# Patient Record
Sex: Male | Born: 1990 | Hispanic: No | Marital: Single | Smoking: Former smoker
Health system: Southern US, Community
[De-identification: ages and names within clinical notes are randomized; demographics above are authoritative.]

---

## 2010-06-03 ENCOUNTER — Emergency Department (HOSPITAL_COMMUNITY)
Admission: EM | Admit: 2010-06-03 | Discharge: 2010-06-04 | Disposition: A | Discharge status: Home / Self Care | Payer: No Typology Code available for payment source | Attending: Emergency Medicine | Admitting: Emergency Medicine

## 2010-06-03 DIAGNOSIS — Y92410 Unspecified street and highway as the place of occurrence of the external cause: Secondary | ICD-10-CM | POA: Insufficient documentation

## 2010-06-03 DIAGNOSIS — T1490XA Injury, unspecified, initial encounter: Secondary | ICD-10-CM | POA: Insufficient documentation

## 2010-06-03 DIAGNOSIS — M25569 Pain in unspecified knee: Secondary | ICD-10-CM | POA: Insufficient documentation

## 2010-06-04 ENCOUNTER — Emergency Department (HOSPITAL_COMMUNITY): Payer: No Typology Code available for payment source

## 2011-01-25 ENCOUNTER — Emergency Department (HOSPITAL_COMMUNITY)
Admission: EM | Admit: 2011-01-25 | Discharge: 2011-01-26 | Disposition: A | Discharge status: Home / Self Care | Payer: BC Managed Care – PPO | Attending: Emergency Medicine | Admitting: Emergency Medicine

## 2011-01-25 DIAGNOSIS — W3400XA Accidental discharge from unspecified firearms or gun, initial encounter: Secondary | ICD-10-CM

## 2011-01-25 DIAGNOSIS — S31809A Unspecified open wound of unspecified buttock, initial encounter: Secondary | ICD-10-CM | POA: Insufficient documentation

## 2011-01-26 ENCOUNTER — Encounter: Payer: Self-pay | Admitting: Emergency Medicine

## 2011-01-26 ENCOUNTER — Emergency Department (HOSPITAL_COMMUNITY): Payer: BC Managed Care – PPO

## 2011-01-26 DIAGNOSIS — S31809A Unspecified open wound of unspecified buttock, initial encounter: Secondary | ICD-10-CM

## 2011-01-26 LAB — URINALYSIS, MICROSCOPIC ONLY
Glucose, UA: NEGATIVE mg/dL
Leukocytes, UA: NEGATIVE
Nitrite: NEGATIVE
pH: 6 (ref 5.0–8.0)

## 2011-01-26 LAB — PROTIME-INR: INR: 0.98 (ref 0.00–1.49)

## 2011-01-26 LAB — POCT I-STAT, CHEM 8
BUN: 3 mg/dL — ABNORMAL LOW (ref 6–23)
Creatinine, Ser: 0.9 mg/dL (ref 0.50–1.35)
Hemoglobin: 17 g/dL (ref 13.0–17.0)
Potassium: 2.9 mEq/L — ABNORMAL LOW (ref 3.5–5.1)
Sodium: 143 mEq/L (ref 135–145)

## 2011-01-26 LAB — COMPREHENSIVE METABOLIC PANEL
BUN: 5 mg/dL — ABNORMAL LOW (ref 6–23)
CO2: 25 mEq/L (ref 19–32)
Calcium: 9.6 mg/dL (ref 8.4–10.5)
Creatinine, Ser: 0.89 mg/dL (ref 0.50–1.35)
GFR calc Af Amer: >90 mL/min (ref >90)
GFR calc non Af Amer: >90 mL/min (ref >90)
Glucose, Bld: 113 mg/dL — ABNORMAL HIGH (ref 70–99)

## 2011-01-26 LAB — SAMPLE TO BLOOD BANK

## 2011-01-26 LAB — CBC
Hemoglobin: 15.4 g/dL (ref 13.0–17.0)
RBC: 5.64 MIL/uL (ref 4.22–5.81)
WBC: 11 10*3/uL — ABNORMAL HIGH (ref 4.0–10.5)

## 2011-01-26 MED ORDER — FENTANYL CITRATE 0.05 MG/ML IJ SOLN
50.0000 ug | Freq: Once | INTRAMUSCULAR | Status: AC
  Administered 2011-01-26: 50 ug via INTRAVENOUS

## 2011-01-26 MED ORDER — FENTANYL CITRATE 0.05 MG/ML IJ SOLN
50.0000 ug | Freq: Once | INTRAMUSCULAR | Status: AC
  Administered 2011-01-26: 50 ug via INTRAVENOUS
  Filled 2011-01-26: qty 2

## 2011-01-26 MED ORDER — IOHEXOL 300 MG/ML  SOLN
100.0000 mL | Freq: Once | INTRAMUSCULAR | Status: AC | PRN
  Administered 2011-01-26: 100 mL via INTRAVENOUS

## 2011-01-26 MED ORDER — TETANUS-DIPHTH-ACELL PERTUSSIS 5-2.5-18.5 LF-MCG/0.5 IM SUSP
0.5000 mL | Freq: Once | INTRAMUSCULAR | Status: AC
  Administered 2011-01-26: 0.5 mL via INTRAMUSCULAR

## 2011-01-26 MED ORDER — POTASSIUM CHLORIDE CRYS ER 20 MEQ PO TBCR
40.0000 meq | EXTENDED_RELEASE_TABLET | Freq: Once | ORAL | Status: AC
  Administered 2011-01-26: 40 meq via ORAL
  Filled 2011-01-26: qty 2

## 2011-01-26 MED ORDER — CEFAZOLIN SODIUM 1-5 GM-% IV SOLN
INTRAVENOUS | Status: AC
  Filled 2011-01-26: qty 50

## 2011-01-26 MED ORDER — CEFAZOLIN SODIUM 1-5 GM-% IV SOLN
1.0000 g | Freq: Once | INTRAVENOUS | Status: AC
  Administered 2011-01-26: 1 g via INTRAVENOUS

## 2011-01-26 MED ORDER — FENTANYL CITRATE 0.05 MG/ML IJ SOLN
INTRAMUSCULAR | Status: AC
  Filled 2011-01-26: qty 2

## 2011-01-26 MED ORDER — LIDOCAINE-EPINEPHRINE-TETRACAINE (LET) SOLUTION
NASAL | Status: AC
  Administered 2011-01-26: 02:00:00
  Filled 2011-01-26: qty 3

## 2011-01-26 MED ORDER — HYDROCODONE-ACETAMINOPHEN 7.5-500 MG/15ML PO SOLN: 15.0000 mL | Freq: Four times a day (QID) | ORAL | Status: AC | PRN

## 2011-01-26 MED ORDER — TETANUS-DIPHTH-ACELL PERTUSSIS 5-2.5-18.5 LF-MCG/0.5 IM SUSP
INTRAMUSCULAR | Status: AC
  Filled 2011-01-26: qty 0.5
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
## 2011-01-26 NOTE — ED Notes (Signed)
Patient returned to room 17. 

## 2011-01-27 ENCOUNTER — Emergency Department (HOSPITAL_COMMUNITY)
Admission: EM | Admit: 2011-01-27 | Discharge: 2011-01-27 | Disposition: A | Discharge status: Home / Self Care | Payer: No Typology Code available for payment source | Attending: Emergency Medicine | Admitting: Emergency Medicine

## 2011-01-27 ENCOUNTER — Encounter: Payer: Self-pay | Admitting: *Deleted

## 2011-01-27 DIAGNOSIS — Z4800 Encounter for change or removal of nonsurgical wound dressing: Secondary | ICD-10-CM | POA: Insufficient documentation

## 2011-01-27 DIAGNOSIS — Z5189 Encounter for other specified aftercare: Secondary | ICD-10-CM

## 2011-01-27 DIAGNOSIS — Z09 Encounter for follow-up examination after completed treatment for conditions other than malignant neoplasm: Secondary | ICD-10-CM | POA: Insufficient documentation
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
## 2011-01-30 ENCOUNTER — Encounter: Payer: Self-pay | Admitting: *Deleted
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
## 2014-09-14 ENCOUNTER — Emergency Department (HOSPITAL_COMMUNITY)
Admission: EM | Admit: 2014-09-14 | Discharge: 2014-09-14 | Disposition: A | Discharge status: Home / Self Care | Payer: BC Managed Care – PPO | Attending: Emergency Medicine | Admitting: Emergency Medicine

## 2014-09-14 ENCOUNTER — Encounter (HOSPITAL_COMMUNITY): Payer: Self-pay

## 2014-09-14 ENCOUNTER — Emergency Department (HOSPITAL_COMMUNITY): Payer: BC Managed Care – PPO

## 2014-09-14 DIAGNOSIS — S022XXA Fracture of nasal bones, initial encounter for closed fracture: Secondary | ICD-10-CM | POA: Insufficient documentation

## 2014-09-14 DIAGNOSIS — S0121XA Laceration without foreign body of nose, initial encounter: Secondary | ICD-10-CM | POA: Insufficient documentation

## 2014-09-14 DIAGNOSIS — S01311A Laceration without foreign body of right ear, initial encounter: Secondary | ICD-10-CM | POA: Insufficient documentation

## 2014-09-14 DIAGNOSIS — Y9389 Activity, other specified: Secondary | ICD-10-CM | POA: Diagnosis not present

## 2014-09-14 DIAGNOSIS — Y998 Other external cause status: Secondary | ICD-10-CM | POA: Diagnosis not present

## 2014-09-14 DIAGNOSIS — S0990XA Unspecified injury of head, initial encounter: Secondary | ICD-10-CM | POA: Diagnosis present

## 2014-09-14 DIAGNOSIS — Y9289 Other specified places as the place of occurrence of the external cause: Secondary | ICD-10-CM | POA: Diagnosis not present

## 2014-09-14 DIAGNOSIS — Z23 Encounter for immunization: Secondary | ICD-10-CM | POA: Insufficient documentation

## 2014-09-14 DIAGNOSIS — S01111A Laceration without foreign body of right eyelid and periocular area, initial encounter: Secondary | ICD-10-CM | POA: Insufficient documentation

## 2014-09-14 DIAGNOSIS — S0181XA Laceration without foreign body of other part of head, initial encounter: Secondary | ICD-10-CM | POA: Insufficient documentation

## 2014-09-14 MED ORDER — TETANUS-DIPHTH-ACELL PERTUSSIS 5-2.5-18.5 LF-MCG/0.5 IM SUSP
0.5000 mL | Freq: Once | INTRAMUSCULAR | Status: AC
  Administered 2014-09-14: 0.5 mL via INTRAMUSCULAR
  Filled 2014-09-14: qty 0.5

## 2014-09-14 MED ORDER — OXYCODONE HCL 5 MG PO TABS
5.0000 mg | ORAL_TABLET | Freq: Once | ORAL | Status: AC
  Administered 2014-09-14: 5 mg via ORAL
  Filled 2014-09-14: qty 1

## 2014-09-14 MED ORDER — LIDOCAINE-EPINEPHRINE 1 %-1:100000 IJ SOLN
10.0000 mL | Freq: Once | INTRAMUSCULAR | Status: AC
  Administered 2014-09-14: 10 mL via INTRADERMAL
  Filled 2014-09-14: qty 1

## 2014-09-14 MED ORDER — IBUPROFEN 800 MG PO TABS
800.0000 mg | ORAL_TABLET | Freq: Once | ORAL | Status: AC
  Administered 2014-09-14: 800 mg via ORAL
  Filled 2014-09-14: qty 1

## 2014-09-14 MED ORDER — ACETAMINOPHEN 500 MG PO TABS
1000.0000 mg | ORAL_TABLET | Freq: Once | ORAL | Status: AC
  Administered 2014-09-14: 1000 mg via ORAL
  Filled 2014-09-14: qty 2
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
## 2014-09-14 NOTE — ED Notes (Signed)
Pt transporting safely to ED waiting room to call mother for transportation home. A/O x4 on departure. Ambulatory with steady gait. VSS. Verbalizes understanding of discharge instructions.
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
## 2014-09-14 NOTE — Discharge Instructions (Signed)
Facial Fracture °A facial fracture is a break in one of the bones of your face. °HOME CARE INSTRUCTIONS  °· Protect the injured part of your face until it is healed. °· Do not participate in activities which give chance for re-injury until your doctor approves. °· Gently wash and dry your face. °· Wear head and facial protection while riding a bicycle, motorcycle, or snowmobile. °SEEK MEDICAL CARE IF:  °· An oral temperature above 102° F (38.9° C) develops. °· You have severe headaches or notice changes in your vision. °· You have new numbness or tingling in your face. °· You develop nausea (feeling sick to your stomach), vomiting or a stiff neck. °SEEK IMMEDIATE MEDICAL CARE IF:  °· You develop difficulty seeing or experience double vision. °· You become dizzy, lightheaded, or faint. °· You develop trouble speaking, breathing, or swallowing. °· You have a watery discharge from your nose or ear. °MAKE SURE YOU:  °· Understand these instructions. °· Will watch your condition. °· Will get help right away if you are not doing well or get worse. °Document Released: 01/07/2005 Document Revised: 04/01/2011 Document Reviewed: 08/27/2007 °[employer]® Patient Information ©2015 [employer]. This information is not intended to replace advice given to you by your health care provider. Make sure you discuss any questions you have with your health care provider. ° ° ° °
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
## 2014-09-17 ENCOUNTER — Emergency Department (HOSPITAL_COMMUNITY)
Admission: EM | Admit: 2014-09-17 | Discharge: 2014-09-17 | Disposition: A | Discharge status: Home / Self Care | Payer: BC Managed Care – PPO | Attending: Emergency Medicine | Admitting: Emergency Medicine

## 2014-09-17 ENCOUNTER — Encounter (HOSPITAL_COMMUNITY): Payer: Self-pay | Admitting: *Deleted

## 2014-09-17 DIAGNOSIS — Z4801 Encounter for change or removal of surgical wound dressing: Secondary | ICD-10-CM | POA: Insufficient documentation

## 2014-09-17 DIAGNOSIS — Z4802 Encounter for removal of sutures: Secondary | ICD-10-CM | POA: Diagnosis present

## 2014-09-17 DIAGNOSIS — Z5189 Encounter for other specified aftercare: Secondary | ICD-10-CM

## 2014-09-17 NOTE — ED Notes (Signed)
Declined W/C at D/C and was escorted to lobby by RN. 

## 2014-09-17 NOTE — ED Notes (Signed)
PT presents today for suture removal to bridge of nose.
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
## 2014-09-17 NOTE — Discharge Instructions (Signed)
Read the information below.  You may return to the Emergency Department at any time for worsening condition or any new symptoms that concern you.  If you develop redness, swelling, pus draining from the wound, or fevers greater than 100.4, return to the ER immediately for a recheck.   ° ° ° °Wound Care °Wound care helps prevent pain and infection.  °You may need a tetanus shot if: °· You cannot remember when you had your last tetanus shot. °· You have never had a tetanus shot. °· The injury broke your skin. °If you need a tetanus shot and you choose not to have one, you may get tetanus. Sickness from tetanus can be serious. °HOME CARE  °· Only take medicine as told by your doctor. °· Clean the wound daily with mild soap and water. °· Change any bandages (dressings) as told by your doctor. °· Put medicated cream and a bandage on the wound as told by your doctor. °· Change the bandage if it gets wet, dirty, or starts to smell. °· Take showers. Do not take baths, swim, or do anything that puts your wound under water. °· Rest and raise (elevate) the wound until the pain and puffiness (swelling) are better. °· Keep all doctor visits as told. °GET HELP RIGHT AWAY IF:  °· Yellowish-white fluid (pus) comes from the wound. °· Medicine does not lessen your pain. °· There is a red streak going away from the wound. °· You have a fever. °MAKE SURE YOU:  °· Understand these instructions. °· Will watch your condition. °· Will get help right away if you are not doing well or get worse. °Document Released: 10/17/2007 Document Revised: 04/01/2011 Document Reviewed: 05/13/2010 °[employer]® Patient Information ©2015 [employer]. This information is not intended to replace advice given to you by your health care provider. Make sure you discuss any questions you have with your health care provider. ° °

## 2014-09-21 ENCOUNTER — Encounter (HOSPITAL_COMMUNITY): Payer: Self-pay | Admitting: Family Medicine

## 2014-09-21 ENCOUNTER — Emergency Department (HOSPITAL_COMMUNITY)
Admission: EM | Admit: 2014-09-21 | Discharge: 2014-09-21 | Disposition: A | Discharge status: Home / Self Care | Payer: BC Managed Care – PPO | Attending: Emergency Medicine | Admitting: Emergency Medicine

## 2014-09-21 DIAGNOSIS — Z4802 Encounter for removal of sutures: Secondary | ICD-10-CM | POA: Insufficient documentation
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
## 2014-09-21 NOTE — ED Notes (Signed)
Pt here for suture removal of multiple places to face.

## 2014-09-21 NOTE — Discharge Instructions (Signed)

## 2014-11-09 ENCOUNTER — Emergency Department (HOSPITAL_COMMUNITY)
Admission: EM | Admit: 2014-11-09 | Discharge: 2014-11-09 | Disposition: A | Discharge status: Home / Self Care | Payer: BC Managed Care – PPO | Attending: Emergency Medicine | Admitting: Emergency Medicine

## 2014-11-09 ENCOUNTER — Encounter (HOSPITAL_COMMUNITY): Payer: Self-pay | Admitting: Emergency Medicine

## 2014-11-09 DIAGNOSIS — L0501 Pilonidal cyst with abscess: Secondary | ICD-10-CM | POA: Insufficient documentation

## 2014-11-09 DIAGNOSIS — Z87891 Personal history of nicotine dependence: Secondary | ICD-10-CM | POA: Insufficient documentation

## 2014-11-09 MED ORDER — LIDOCAINE-EPINEPHRINE (PF) 2 %-1:200000 IJ SOLN
20.0000 mL | Freq: Once | INTRAMUSCULAR | Status: AC
  Administered 2014-11-09: 20 mL
  Filled 2014-11-09: qty 20

## 2014-11-09 MED ORDER — SULFAMETHOXAZOLE-TRIMETHOPRIM 800-160 MG PO TABS: 1.0000 | ORAL_TABLET | Freq: Two times a day (BID) | ORAL | Status: DC

## 2014-11-09 MED ORDER — SULFAMETHOXAZOLE-TRIMETHOPRIM 800-160 MG PO TABS: 2.0000 | ORAL_TABLET | Freq: Two times a day (BID) | ORAL | Status: AC

## 2014-11-09 NOTE — Discharge Instructions (Signed)
Read the information below.  Use the prescribed medication as directed.  Please discuss all new medications with your pharmacist.  You may return to the Emergency Department at any time for worsening condition or any new symptoms that concern you.  If you develop redness, swelling, uncontrolled pain, or fevers greater than 100.4, return to the ER immediately for a recheck.    Please come to the Emergency Department or [facility] in 2 days for packing removal and abscess recheck.   Incision and Drainage of a Pilonidal Cyst, Care After Refer to this sheet in the next few weeks. These instructions provide you with information on caring for yourself after your procedure. Your health care provider may also give you more specific instructions. Your treatment has been planned according to current medical practices, but problems sometimes occur. Call your health care provider if you have any problems or questions after your procedure. WHAT TO EXPECT AFTER THE PROCEDURE After your procedure, it is typical to have the following:  Pain near or at the surgical area.  Blood-tinged discharge on your wound packing or your bandage (dressing). HOME CARE INSTRUCTIONS  Take medicines only as directed by your health care provider.  If you were prescribed an antibiotic medicine, finish it all even if you start to feel better.  To prevent constipation:  Drink enough fluid to keep your urine clear or pale yellow.  Include lots of whole grains, fruits, and vegetables in your diet.  Do not do activities that irritate or put pressure on your buttocks for about 2 weeks or as directed by your health care provider. These include bike riding, running, and anything that involves a twisting motion.  Do not sit for long periods of time.  Sleep on your side instead of your back.  Ask your health care provider when you can return to work and resume your usual activities.  Wear loose, cotton  underwear.  Keep all follow-up visits as directed by your health care provider. This is important. If you had a surgical cut (incision) and drainage with wound packing:  Return to your health care provider as instructed to have your packing changed or removed.  Keep the incision area dry until your packing has been removed.  After the packing has been removed, you can start taking showers or baths.  Clean your buttocks area with soap and water.  Pat the area dry with a soft, clean towel. If you had a marsupialization procedure:  You can start taking showers or baths the day after surgery.  Let the water from the shower or bath moisten your dressing before you remove it.  After your shower or bath, pat your buttocks area dry with a soft, clean towel and replace your dressing.  Ask your health care provider:  When you can stop using a dressing.  When you can start taking showers or baths. If you had a surgical cut (incision) and drainage without packing:  Do not get your incision area wet for about 4 days or as directed by your health care provider.  Ask your health care provider when you can start taking showers or baths.  You may be able to start taking showers or baths after 4 days or as directed by your health care provider.  Go back to your health care provider when it is time for your sutures to be taken out. SEEK MEDICAL CARE IF:  Your incision is bleeding.  You have signs of infection at your incision or  around the incision. Watch for:  Drainage.  Redness.  Swelling.  Pain.  There is a bad smell coming from your incision site.  Your pain medicine is not helping.  You have a fever or chills.  You have muscles aches.  You are dizzy.  You feel generally ill.   This information is not intended to replace advice given to you by your health care provider. Make sure you discuss any questions you have with your health care provider.   Document Released:  02/07/2006 Document Revised: 01/28/2014 Document Reviewed: 05/27/2013 Elsevier Interactive Patient Education 2016 Elsevier Inc.   Incision and Drainage of a Pilonidal Cyst Incision and drainage is a surgical procedure to open and drain a fluid-filled sac that forms around a hair follicle in the tailbone area between your buttocks (pilonidal cyst). You may need this procedure if the cyst becomes painful, swollen, or infected. There are three types of procedures that may be done. The type of procedure you have depends on the size and severity of your infected cyst. The procedure may be:  Incision and drainage with a special type of bandage (wound packing). Packing is used for wounds that are deep or tunnel under the skin.  Marsupialization. In this procedure, the cyst will be opened and kept open. The edges of the incision will be stitched together to make a pocket.  Incision and drainage without wound packing. LET [facility] CARE PROVIDER KNOW ABOUT:  Any allergies you have.  All medicines you are taking, including vitamins, herbs, eye drops, creams, and over-the-counter medicines.  Previous problems you or members of your family have had with the use of anesthetics.  Any blood disorders you have.  Previous surgeries you have had.  Medical conditions you have. RISKS AND COMPLICATIONS Generally, this is a safe procedure. However, problems can occur and include:  Infection.  Bleeding.  Having another cyst develop.  Need for more surgery. BEFORE THE PROCEDURE  Ask your health care provider about:  Changing or stopping your regular medicines. This is especially important if you are taking diabetes medicines or blood thinners.  Taking medicines such as aspirin and ibuprofen. These medicines can thin your blood. Do not take these medicines before your procedure if your health care provider tells you not to.  Taking antibiotics before surgery to control the infection.  Do not  eat or drink anything for 6-8 hours before the procedure if you are having general anesthesia.  Take a shower the night before the procedure to clean your buttocks area. Take another shower in the morning before surgery.  Plan to have someone take you home after the procedure. PROCEDURE   You will have an IV tube inserted in a vein in your hand or arm.  You will be given one of the following:  A medicine that numbs the area (local anesthetic).  A medicine that makes you go to sleep (general anesthetic).  You also may be given medicine to help you relax during the procedure (sedative).  You will lie face down on the operating table.  Your buttocks area may be shaved.  Tape may be used to spread your buttocks.  Germ-killing solution (antiseptic) may be used to clean the area. Incision and Drainage With Wound Packing:  Your surgeon will make a surgical cut (incision) over the cyst to open it.  A probe may be used to see if there are tunnels extending away from the cyst under your skin.  Fluid or pus inside the cyst  will be drained.  The cyst will be flushed out with a germ-free (sterile) solution.  Packing will be placed into the open cyst. This keeps it open and draining after surgery.  The area will be covered with a bandage (dressing). Marsupialization:  Your surgeon will make a surgical cut (incision) over the cyst to open it.  A probe may be used to see if there are tunnels extending away from the cyst under your skin.  Fluid or pus inside the cyst will be drained.  The cyst will be flushed out with a germ-free (sterile) solution.  The edges of the incision will be stitched (sutured) to the skin to keep it wide open. The cyst will not be packed.  A rolled-up bandage (dressing) will be taped over the incision. Incision and Drainage Without Packing:  Your surgeon will make a surgical cut (incision) over the cyst to open it.  A probe may be used to see if there  are tunnels extending away from the cyst under your skin.  Fluid or pus inside the cyst will be drained.  The cyst will be flushed out with a germ-free (sterile) solution.  Your surgeon may also remove the tissue around the opened cyst.  The incision then will be closed with stitches (sutures). It will not be left open, and packing will not be used.  A bandage (dressing) will be put over the incision area. AFTER THE PROCEDURE  If you had general anesthesia, you will be taken to a recovery area. Your blood pressure, heart rate, breathing rate, and blood oxygen level will be monitored often until the medicines you were given have worn off.  It is normal to have some pain after this procedure. You may be given pain medicine.  Your IV tube can be taken out after you have recovered and your pain is under control.   This information is not intended to replace advice given to you by your health care provider. Make sure you discuss any questions you have with your health care provider.   Document Released: 08/04/2013 Document Reviewed: 08/04/2013 Elsevier Interactive Patient Education [employer]2016 Elsevier Inc.

## 2014-11-09 NOTE — ED Provider Notes (Signed)
CSN: [id number]     Arrival date & time 11/09/14  1418 History   First MD Initiated Contact with Patient 11/09/14 1420     Chief Complaint  Patient presents with  . Cyst     (Consider location/radiation/quality/duration/timing/severity/associated sxs/prior Treatment) The history is provided by the patient.     Patient presents with pain and swelling over his lower back/upper buttock x 2 weeks.  The area is sore, worse with palpation.  No drainage.  Has had it cut open and drained previously.  Has also been referred to general surgery but has not followed up.  Has felt hot all over but no known fevers, no chills.  Denies abdominal pain, change in bowel or bladder habits.    History reviewed. No pertinent past medical history. History reviewed. No pertinent past surgical history. No family history on file. Social History  Substance Use Topics  . Smoking status: Former Games developer  . Smokeless tobacco: None  . Alcohol Use: Yes    Review of Systems  Constitutional: Negative for fever and chills.  Musculoskeletal: Negative for myalgias.  Skin: Negative for color change and wound.  Allergic/Immunologic: Negative for immunocompromised state.  Hematological: Does not bruise/bleed easily.  Psychiatric/Behavioral: Negative for self-injury.      Allergies  Review of patient's allergies indicates no known allergies.  Home Medications   Prior to Admission medications   Not on File   BP 136/92 mmHg  Pulse 100  Temp(Src) 98.2 F (36.8 C) (Oral)  Resp 18  SpO2 98% Physical Exam  Constitutional: He appears well-developed and well-nourished. No distress.  HENT:  Head: Normocephalic and atraumatic.  Neck: Neck supple.  Pulmonary/Chest: Effort normal.  Neurological: He is alert.  Skin: He is not diaphoretic.     Nursing note and vitals reviewed.   ED Course  Procedures (including critical care time) Labs Review Labs Reviewed - No data to display  Imaging Review No results  found. I have personally reviewed and evaluated these images and lab results as part of my medical decision-making.   EKG Interpretation None       2:37 PM Pt offered pain medication prior to procedure, he declined as he drove to ED and intends to drive home.    INCISION AND DRAINAGE Performed by: Trixie Dredge Consent: Verbal consent obtained. Risks and benefits: risks, benefits and alternatives were discussed Type: abscess  Body area: pilonidal/just superior to natal cleft  Anesthesia: local infiltration  Incision was made with a scalpel.  Local anesthetic: lidocaine 2% with epinephrine  Anesthetic total: 6 ml  Complexity: complex Blunt dissection to break up loculations  Drainage: purulent  Drainage amount: large   Thoroughly irrigated with normal saline  Packing material: 1/4 in iodoform gauze  Patient tolerance: Patient tolerated the procedure well with no immediate complications.  EMERGENCY DEPARTMENT US SOFT TISSUE INTERPRETATION "Study: Limited Ultrasound of the noted body part in comments below"  INDICATIONS: Pain Multiple views of the body part are obtained with a multi-frequency linear probe  PERFORMED BY:  Myself  IMAGES ARCHIVED?: Yes  SIDE:Midline  BODY PART:Lower back  FINDINGS: Abcess present  LIMITATIONS:  Body Habitus and Emergent Procedure  INTERPRETATION:  Abcess present  COMMENT:  Deep pilonidal abscess     MDM   Final diagnoses:  Pilonidal abscess    Afebrile, nontoxic patient with recurrent pilonidal abscess.  No systemic symptoms.  No overlying cellulitis.  I&D in ED.   D/C home with bactrim x 1 week, general surgery referral.  Packing in place - instructed to return or see general surgery in 2 days for recheck and packing removal.  Discussed result, findings, treatment, and follow up  with patient.  Pt given return precautions.  Pt verbalizes understanding and agrees with plan.       [facility] [place]  I have advised close follow up with them.      Trixie Dredgemily Blakelee Allington, PA-C 11/09/14 1530  ViolaEmily Vint Pola, PA-C 11/09/14 1531  Gilda Creasehristopher J Pollina, MD 11/10/14 [phone]

## 2014-11-09 NOTE — ED Notes (Signed)
Patient states cyst to lower back, top part of butt.   Patient states had drained previously, but it came back.

## 2014-12-27 ENCOUNTER — Emergency Department (HOSPITAL_COMMUNITY)
Admission: EM | Admit: 2014-12-27 | Discharge: 2014-12-27 | Disposition: A | Discharge status: Home / Self Care | Payer: No Typology Code available for payment source | Attending: Emergency Medicine | Admitting: Emergency Medicine

## 2014-12-27 ENCOUNTER — Encounter (HOSPITAL_COMMUNITY): Payer: Self-pay | Admitting: Emergency Medicine

## 2014-12-27 DIAGNOSIS — L0591 Pilonidal cyst without abscess: Secondary | ICD-10-CM | POA: Diagnosis not present

## 2014-12-27 DIAGNOSIS — L0231 Cutaneous abscess of buttock: Secondary | ICD-10-CM | POA: Diagnosis present

## 2014-12-27 DIAGNOSIS — Z87891 Personal history of nicotine dependence: Secondary | ICD-10-CM | POA: Diagnosis not present

## 2014-12-27 MED ORDER — DOXYCYCLINE HYCLATE 100 MG PO TABS
100.0000 mg | ORAL_TABLET | Freq: Once | ORAL | Status: AC
  Administered 2014-12-27: 100 mg via ORAL
  Filled 2014-12-27: qty 1

## 2014-12-27 MED ORDER — TRAMADOL HCL 50 MG PO TABS
50.0000 mg | ORAL_TABLET | Freq: Once | ORAL | Status: AC
  Administered 2014-12-27: 50 mg via ORAL
  Filled 2014-12-27: qty 1

## 2014-12-27 MED ORDER — DOXYCYCLINE HYCLATE 100 MG PO CAPS: 100.0000 mg | ORAL_CAPSULE | Freq: Two times a day (BID) | ORAL | Status: AC
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
## 2014-12-27 NOTE — ED Notes (Signed)
Pt c/o abscess to coccyx area x's 1 week with no drainage

## 2014-12-27 NOTE — Discharge Instructions (Signed)
Pilonidal Cyst Return for increased swelling or fever. Take antibiotics as prescribed.  A pilonidal cyst is a fluid-filled sac. It forms beneath the skin near your tailbone, at the top of the crease of your buttocks. A pilonidal cyst that is not large or infected may not cause symptoms or problems. If the cyst becomes irritated or infected, it may fill with pus. This causes pain and swelling (pilonidal abscess). An infected cyst may need to be treated with medicine, drained, or removed. CAUSES The cause of a pilonidal cyst is not known. One cause may be a hair that grows into your skin (ingrown hair). RISK FACTORS Pilonidal cysts are more common in boys and men. Risk factors include:  Having lots of hair near the crease of the buttocks.  Being overweight.  Having a pilonidal dimple.  Wearing tight clothing.  Not bathing or showering frequently.  Sitting for long periods of time. SIGNS AND SYMPTOMS Signs and symptoms of a pilonidal cyst may include:  Redness.  Pain and tenderness.  Warmth.  Swelling.  Pus.  Fever. DIAGNOSIS Your health care provider may diagnose a pilonidal cyst based on your symptoms and a physical exam. The health care provider may do a blood test to check for infection. If your cyst is draining pus, your health care provider may take a sample of the drainage to be tested at a laboratory. TREATMENT Surgery is the usual treatment for an infected pilonidal cyst. You may also have to take medicines before surgery. The type of surgery you have depends on the size and severity of the infected cyst. The different kinds of surgery include:  Incision and drainage. This is a procedure to open and drain the cyst.  Marsupialization. In this procedure, a large cyst or abscess may be opened and kept open by stitching the edges of the skin to the cyst walls.  Cyst removal. This procedure involves opening the skin and removing all or part of the cyst. HOME CARE  INSTRUCTIONS  Follow all of your surgeon's instructions carefully if you had surgery.  Take medicines only as directed by your health care provider.  If you were prescribed an antibiotic medicine, finish it all even if you start to feel better.  Keep the area around your pilonidal cyst clean and dry.  Clean the area as directed by your health care provider. Pat the area dry with a clean towel. Do not rub it as this may cause bleeding.  Remove hair from the area around the cyst as directed by your health care provider.  Do not wear tight clothing or sit in one place for long periods of time.  There are many different ways to close and cover an incision, including stitches, skin glue, and adhesive strips. Follow your health care provider's instructions on:  Incision care.  Bandage (dressing) changes and removal.  Incision closure removal. SEEK MEDICAL CARE IF:   You have drainage, redness, swelling, or pain at the site of the cyst.  You have a fever.   This information is not intended to replace advice given to you by your health care provider. Make sure you discuss any questions you have with your health care provider.   Document Released: 01/05/2000 Document Revised: 01/28/2014 Document Reviewed: 05/27/2013 Elsevier Interactive Patient Education 2016 [employer].  Emergency Department Resource Guide 1) Find a Doctor and Pay Out of Pocket Although you won't have to find out who is covered by your insurance plan, it is a good idea to ask  around and get recommendations. You will then need to call the office and see if the doctor you have chosen will accept you as a new patient and what types of options they offer for patients who are self-pay. Some doctors offer discounts or will set up payment plans for their patients who do not have insurance, but you will need to ask so you aren't surprised when you get to your appointment.  2) Contact Your Local Health Department Not all  health departments have doctors that can see patients for sick visits, but many do, so it is worth a call to see if yours does. If you don't know where your local health department is, you can check in your phone book. The CDC also has a tool to help you locate your state's health department, and many state websites also have listings of all of their local health departments.  3) Find a Walk-in Clinic If your illness is not likely to be very severe or complicated, you may want to try a walk in clinic. These are popping up all over the country in pharmacies, drugstores, and shopping centers. They're usually staffed by nurse practitioners or physician assistants that have been trained to treat common illnesses and complaints. They're usually fairly quick and inexpensive. However, if you have serious medical issues or chronic medical problems, these are probably not your best option.  No Primary Care Doctor: - Call Health Connect at  [phone] - they can help you locate a primary care doctor that  accepts your insurance, provides certain services, etc. - Physician Referral Service- [phone]  Chronic Pain Problems: Organization         Address  Phone   Notes  [facility]  [phone] Patients need to be referred by their primary care doctor.   Medication Assistance: Organization         Address  Phone   Notes  [facility]ication [facility] [address] [place], [place] [address] [phone] --Must be a resident of [place] -- Must have NO insurance coverage whatsoever (no Medicaid/ Medicare, etc.) -- The pt. MUST have a primary care doctor that directs their care regularly and follows them in the community   MedAssist  [phone]   [employer]  [phone]    Agencies that provide inexpensive medical care: Organization         Address  Phone   Notes  [facility]  [phone]   [facility]     [phone]   [facility] [address] [place], [place] [address] [phone]   [facility] [address], [place] [phone]   [facility]    [phone]   [facility]    [phone]   [facility]  [address], [place] Phone:  [phone], Fax:  [phone] Hours of Operation:  9 am - 6 pm, M-F.  Also accepts Medicaid/Medicare and self-pay.  [facility]  [address], [place] Phone: [phone], Fax: [phone]. Hours of Operation:  8:30 am - 5:30 pm, M-F.  Also accepts Medicaid and self-pay.  [facility] [place] [address] [place], [place] Phone: [phone]   [facility] [address], [place], [place] [phone], Ext. [phone] Mondays & Thursdays: 7-9 AM.  First 15 patients are seen  on a first come, first serve basis.    Medicaid-accepting [facility] Providers:  Organization         Address  Phone   Notes  [facility] [address], [place] [phone] Also accepts self-pay patients.  [facility] [address] Laurell Josephs [place], [place]  [phone]   [facility] [address], [place] [phone]   [facility] [address] [place] [phone]   Renaye Rakers [address], [place]   [phone] Only accepts [place] Access [place] patients after they have their name applied to their card.   Self-Pay (no insurance) in [facility]:  Organization         Address  Phone   Notes  Sickle Cell Patients, [facility] [address] [place], [place] [phone]   [facility] [address] [place], [place] [phone]   [facility] [place]  [address], [place] [phone]     [facility]/Dr. Osei-Bonsu  [address], [place] or [address], [place] [phone] Phone number for both [place] and [place] locations is the same.  [facility]gent Medical and [facility] [address], [place] [phone]   [facility] [address], [place] or [address] [phone]   [facility] [address], [place] [phone], phone; [phone], fax Sees patients 1st and 3rd Saturday of every month.  Must not qualify for public or private insurance (i.e. Medicaid, Medicare, [place] Health Choice, Veterans' Benefits)  Household income should be no more than 200% of the poverty level The clinic cannot treat you if you are pregnant or think you are pregnant  Sexually transmitted diseases are not treated at the clinic.    Dental Care: Organization         Address  Phone  Notes  [facility] Department of [facility] [address] [place], [place] [phone] Accepts children up to age 1 who are enrolled in [place] or [place] Health Choice; pregnant women with a Medicaid card; and children who have applied for Medicaid or [place] Health Choice, but were declined, whose parents can pay a reduced fee at time of service.  [facility] of [facility]  [address], [place] [phone] Accepts children up to age 74 who are enrolled in [place] or [place] Health Choice; pregnant women with a Medicaid card; and children who have applied for Medicaid or  Health Choice, but were declined, whose parents can pay a reduced fee at time of service.  [facility]  [address] [place], [place] [phone] Patients are seen by appointment only. Walk-ins are not accepted. [facility] will see patients 56 years of age and older. Monday - Tuesday (8am-5pm) Most Wednesdays (8:30-5pm) $30 per visit,  cash only  [facility] Adult Dental Access PROGRAM  [address], [facility] [phone] Patients are seen by appointment only. Walk-ins are not accepted. [facility] will see patients 98 years of age and older. One Wednesday Evening (Monthly: Volunteer Based).  $30 per visit, cash only  [employer]  [phone] for adults; Children under age 23, call Graduate Pediatric Dentistry at [phone])  [phone]. Children aged 44-14, please call [phone] to request a pediatric application.  Dental services are provided in all areas of dental care including fillings, crowns and bridges, complete and partial dentures, implants, gum treatment, root canals, and extractions. Preventive care is also provided. Treatment is provided to both adults and children. Patients are selected via a lottery and there is often a waiting list.   [facility] [address], [place]  [phone] [email or web address]   [facility] [address] [place], [place] [phone], Ext. [phone] Second and Fourth Thursday of each month, opens at 6:30 AM; Clinic ends at 9 AM.  Patients are seen on a first-come first-served basis, and a limited number are seen during each clinic.   [facility]  [address] Ether Griffins [place], [place] [phone]   Eligibility Requirements You must have lived in [place], [place], or [place] counties for at least the last three months.   You cannot be eligible for state or federal sponsored [place], including [employer], [place], or [employer].   You generally cannot be eligible for healthcare insurance through your employer.    How to apply: Eligibility screenings are held every Tuesday and Wednesday afternoon from 1:00 pm until 4:00 pm. You do not need an appointment for the interview!  [facility] [address], [place], [place] [phone]   [facility] Health Department   [phone]   [facility]  [phone]   [facility] Health Department  [phone]    Behavioral Health Resources in the Community: Intensive Outpatient Programs Organization         Address  Phone  Notes  [facility] Services [address] [place], [place] [phone]   [facility] Outpatient [address] [place], [place] [phone]   [facility] [address], [place], [place]  [phone]   [facility] [address],  [place], [place] [phone] or [phone]   Substance Abuse Resources Organization         Address  Phone  Notes  [facility] Drug Services  [phone]   [facility]  [phone]   [facility] [place]  [phone]   Floydene Flock  [phone]   Residential & Outpatient Substance Abuse Program  [phone]   Psychological Services Organization         Address  Phone  Notes  [facility] Health  [phone]   [facility] Services  [phone]   [facility] Mental Health [address] [place] [phone] or [phone]    Mobile Crisis Teams Organization         Address  Phone  Notes  [facility], Mobile Crisis Care Unit  [phone]   [facility]  [address] [place], [place] [phone]   Doristine Locks [address] [place] [phone]    Self-Help/Support Groups Organization         Address  Phone             Notes  [facility] [place] - variety of support groups  [phone]- I7437963 Call for more information  Narcotics Anonymous (NA), [facility] [address], [employer] [place]  2 meetings at this location   Statistician         Address  Phone  Notes  [facility] [address],    [place]  [phone]  [facility]  [address] [place] [address], [place], [place] [phone]    [facility] [address] [place], [place] [phone] Admissions: 8am-3pm M-F  [facility] [address],    [place], [place] [phone]   [facility] [address] [place], [place], [place] [phone]   [facility] [address]  [place], [place] [phone]   [facility] - Intensive Outpatient [address] Laurell Josephs [address], [place], [place] [phone]   [facility] ([facility]iction [facility] [address] [place]  [place], [place] [phone] or [phone]   [facility] ([facility]) [address] [place], [place] [phone] Accepts Medicaid  [facility] [place] [address]  [place] [phone] Substance Abuse/Addiction Treatment   [facility] Organization         Address  Phone  Notes  [facility]  [phone]   Angie Fava, PhD [address] [place]vin [place], [place]   [phone] or [phone]   [facility] Behavioral   [address] [place], [place] [phone]   [facility] [address] [place], [place] [phone] Insurance/Medicaid/sponsorship through [facility] and Families [address]                                    [place], [place] [phone] Therapy/tele-psych/case  [facility] [address], [place] [phone]    Dr. Lolly Mustache  [phone]   [facility]  [employer] [place] [facility] Dept. 1) [address], [place] 2) [address], [place] 3)  [address], [place] [phone]  [phone]   [facility] Child Abuse Hotline 725-627-2857 or 7860289239 (After Hours)
